# Patient Record
Sex: Female | Born: 1935
Health system: Southern US, Community
[De-identification: ages and names within clinical notes are randomized; demographics above are authoritative.]

---

## 2003-08-09 ENCOUNTER — Other Ambulatory Visit: Payer: Self-pay

## 2003-08-13 ENCOUNTER — Other Ambulatory Visit: Payer: Self-pay

## 2004-03-31 ENCOUNTER — Other Ambulatory Visit: Payer: Self-pay

## 2004-03-31 ENCOUNTER — Inpatient Hospital Stay: Payer: Self-pay | Admitting: Internal Medicine

## 2004-07-05 ENCOUNTER — Ambulatory Visit: Payer: Self-pay | Admitting: Internal Medicine

## 2005-02-09 ENCOUNTER — Emergency Department: Payer: Self-pay | Admitting: Emergency Medicine

## 2005-02-09 ENCOUNTER — Other Ambulatory Visit: Payer: Self-pay

## 2005-02-10 ENCOUNTER — Inpatient Hospital Stay: Payer: Self-pay | Admitting: Cardiovascular Disease

## 2005-02-10 ENCOUNTER — Other Ambulatory Visit: Payer: Self-pay

## 2005-02-11 ENCOUNTER — Other Ambulatory Visit: Payer: Self-pay

## 2005-02-28 ENCOUNTER — Ambulatory Visit: Payer: Self-pay | Admitting: Internal Medicine

## 2005-03-21 ENCOUNTER — Other Ambulatory Visit: Payer: Self-pay

## 2005-03-21 ENCOUNTER — Inpatient Hospital Stay: Payer: Self-pay | Admitting: Internal Medicine

## 2005-04-02 ENCOUNTER — Ambulatory Visit: Payer: Self-pay | Admitting: Internal Medicine

## 2006-07-21 ENCOUNTER — Inpatient Hospital Stay: Payer: Self-pay | Admitting: Gastroenterology

## 2006-09-04 ENCOUNTER — Ambulatory Visit: Payer: Self-pay | Admitting: Internal Medicine

## 2006-09-15 ENCOUNTER — Inpatient Hospital Stay: Payer: Self-pay | Admitting: Internal Medicine

## 2006-09-15 ENCOUNTER — Other Ambulatory Visit: Payer: Self-pay

## 2006-09-16 ENCOUNTER — Other Ambulatory Visit: Payer: Self-pay

## 2007-06-23 ENCOUNTER — Inpatient Hospital Stay: Payer: Self-pay | Admitting: Internal Medicine

## 2007-06-23 ENCOUNTER — Other Ambulatory Visit: Payer: Self-pay

## 2007-09-21 ENCOUNTER — Ambulatory Visit: Payer: Self-pay | Admitting: Internal Medicine

## 2008-06-15 ENCOUNTER — Ambulatory Visit: Payer: Self-pay | Admitting: Internal Medicine

## 2008-09-22 ENCOUNTER — Ambulatory Visit: Payer: Self-pay | Admitting: Internal Medicine

## 2008-10-29 ENCOUNTER — Emergency Department: Payer: Self-pay | Admitting: Internal Medicine

## 2009-09-25 ENCOUNTER — Ambulatory Visit: Payer: Self-pay | Admitting: Internal Medicine

## 2009-10-05 ENCOUNTER — Emergency Department: Payer: Self-pay | Admitting: Unknown Physician Specialty

## 2011-08-02 ENCOUNTER — Encounter: Payer: Self-pay | Admitting: Otolaryngology

## 2011-11-01 ENCOUNTER — Emergency Department: Payer: Self-pay | Admitting: *Deleted

## 2012-06-06 ENCOUNTER — Ambulatory Visit: Payer: Self-pay | Admitting: Orthopedic Surgery

## 2012-06-06 LAB — CBC WITH DIFFERENTIAL/PLATELET
Basophil %: 0.6 %
Eosinophil #: 0.4 10*3/uL (ref 0.0–0.7)
Eosinophil %: 4.5 %
HCT: 32.8 % — ABNORMAL LOW (ref 35.0–47.0)
Lymphocyte %: 24.5 %
MCH: 29.8 pg (ref 26.0–34.0)
MCHC: 32.6 g/dL (ref 32.0–36.0)
Monocyte #: 0.6 x10 3/mm (ref 0.2–0.9)
Neutrophil #: 5.2 10*3/uL (ref 1.4–6.5)

## 2012-06-06 LAB — BASIC METABOLIC PANEL
Anion Gap: 9 (ref 7–16)
BUN: 19 mg/dL — ABNORMAL HIGH (ref 7–18)
Calcium, Total: 9.1 mg/dL (ref 8.5–10.1)
Chloride: 104 mmol/L (ref 98–107)
Creatinine: 1.45 mg/dL — ABNORMAL HIGH (ref 0.60–1.30)
EGFR (African American): 38 — ABNORMAL LOW
Osmolality: 276 (ref 275–301)

## 2012-06-12 ENCOUNTER — Encounter: Payer: Self-pay | Admitting: Internal Medicine

## 2012-06-17 ENCOUNTER — Encounter: Payer: Self-pay | Admitting: Internal Medicine

## 2012-06-17 ENCOUNTER — Ambulatory Visit: Payer: Self-pay | Admitting: Hospice and Palliative Medicine

## 2012-06-25 LAB — BASIC METABOLIC PANEL
Co2: 29 mmol/L (ref 21–32)
Creatinine: 0.86 mg/dL (ref 0.60–1.30)
EGFR (African American): >60
EGFR (Non-African Amer.): >60
Glucose: 100 mg/dL — ABNORMAL HIGH (ref 65–99)
Potassium: 4.5 mmol/L (ref 3.5–5.1)

## 2012-06-25 LAB — CBC WITH DIFFERENTIAL/PLATELET
Basophil #: 0.1 10*3/uL (ref 0.0–0.1)
Basophil %: 1.3 %
Eosinophil #: 0.2 10*3/uL (ref 0.0–0.7)
Eosinophil %: 2.5 %
HCT: 37.5 % (ref 35.0–47.0)
HGB: 12.4 g/dL (ref 12.0–16.0)
MCH: 31.3 pg (ref 26.0–34.0)
Monocyte %: 11.9 %
Neutrophil %: 50.9 %
RBC: 3.97 10*6/uL (ref 3.80–5.20)
RDW: 14.8 % — ABNORMAL HIGH (ref 11.5–14.5)
WBC: 7.3 10*3/uL (ref 3.6–11.0)

## 2012-07-13 LAB — CBC WITH DIFFERENTIAL/PLATELET
Basophil #: 0 10*3/uL (ref 0.0–0.1)
Eosinophil #: 0 10*3/uL (ref 0.0–0.7)
Eosinophil %: 0 %
HCT: 39.6 % (ref 35.0–47.0)
Lymphocyte %: 11.2 %
MCHC: 32.7 g/dL (ref 32.0–36.0)
Monocyte #: 2 x10 3/mm — ABNORMAL HIGH (ref 0.2–0.9)
Monocyte %: 14.7 %
Neutrophil #: 10.1 10*3/uL — ABNORMAL HIGH (ref 1.4–6.5)
RBC: 4.23 10*6/uL (ref 3.80–5.20)
RDW: 15 % — ABNORMAL HIGH (ref 11.5–14.5)

## 2012-07-13 LAB — URINALYSIS, COMPLETE
Bilirubin,UR: NEGATIVE
Blood: NEGATIVE
Glucose,UR: NEGATIVE mg/dL (ref 0–75)
Hyaline Cast: 1
Leukocyte Esterase: NEGATIVE
Nitrite: NEGATIVE
Ph: 6 (ref 4.5–8.0)
Protein: NEGATIVE
RBC,UR: 1 /HPF (ref 0–5)
Specific Gravity: 1.015 (ref 1.003–1.030)
Squamous Epithelial: NONE SEEN
WBC UR: 2 /HPF (ref 0–5)

## 2012-07-13 LAB — COMPREHENSIVE METABOLIC PANEL
Albumin: 3.1 g/dL — ABNORMAL LOW (ref 3.4–5.0)
Anion Gap: 10 (ref 7–16)
Bilirubin,Total: 0.4 mg/dL (ref 0.2–1.0)
Calcium, Total: 9.9 mg/dL (ref 8.5–10.1)
Creatinine: 0.77 mg/dL (ref 0.60–1.30)
EGFR (Non-African Amer.): >60
Glucose: 130 mg/dL — ABNORMAL HIGH (ref 65–99)
Potassium: 3.6 mmol/L (ref 3.5–5.1)
SGPT (ALT): 9 U/L — ABNORMAL LOW (ref 12–78)
Sodium: 136 mmol/L (ref 136–145)
Total Protein: 6.4 g/dL (ref 6.4–8.2)

## 2012-07-18 ENCOUNTER — Encounter: Payer: Self-pay | Admitting: Internal Medicine

## 2012-08-15 DEATH — deceased

## 2012-08-25 ENCOUNTER — Ambulatory Visit: Payer: Self-pay | Admitting: Internal Medicine

## 2013-07-12 IMAGING — CT CT CERVICAL SPINE WITHOUT CONTRAST
1 series · 12 of 14 positions shown, 15 images · non-contrast
Comparison: None

REASON FOR EXAM: fall, distracting injury
COMMENTS:

PROCEDURE:     CT  - CT CERVICAL SPINE WO  - June 06, 2012  [DATE]
RESULT:     Clinical Indication: Trauma
TECHNIQUE: Multiple axial CT images from the skull base to the mid vertebral
body of T1. obtained with sagittal and coronal reformatted images provided.

[Series 5: axial · axial · 0.33mm/px · z∈[-344,-212]mm · 12 of 96 slices shown, 15 images]
[im 8/96  soft-tissue]
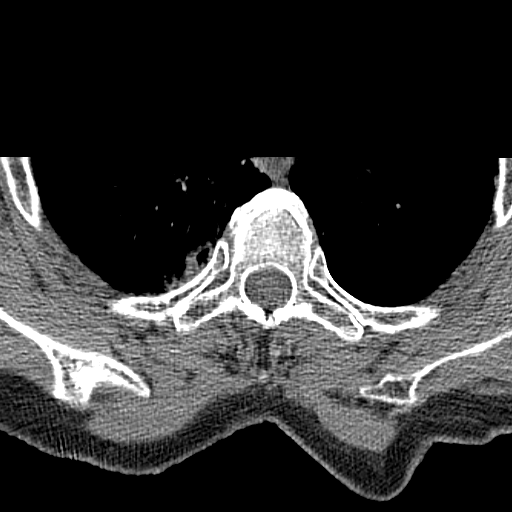
[im 8/96  bone]
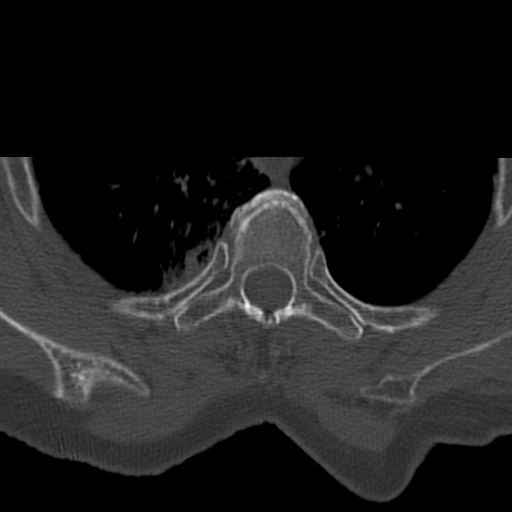
[im 15/96  bone]
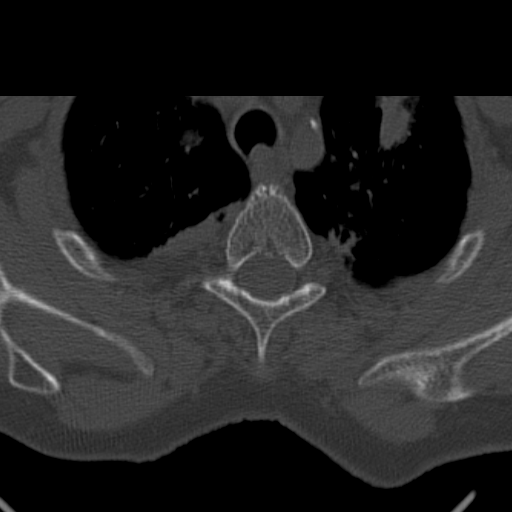
[im 22/96  bone]
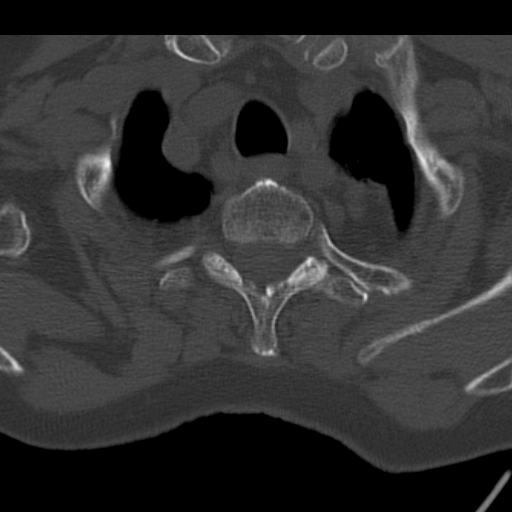
[im 30/96  bone]
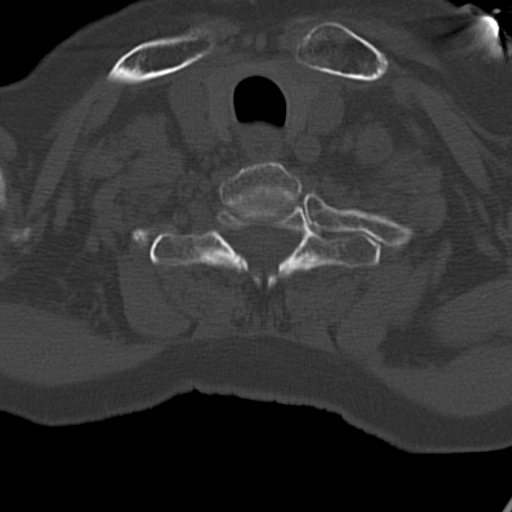
[im 37/96  soft-tissue]
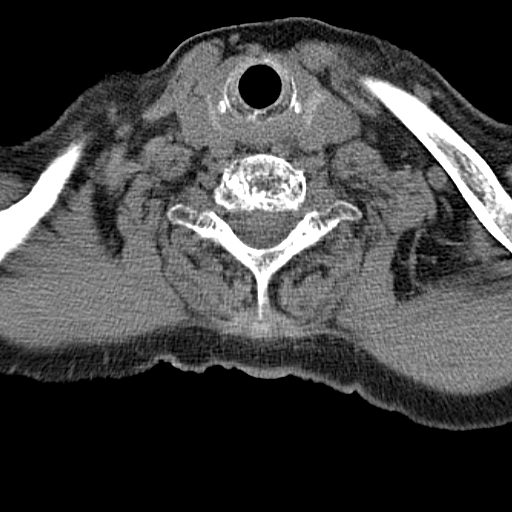
[im 37/96  bone]
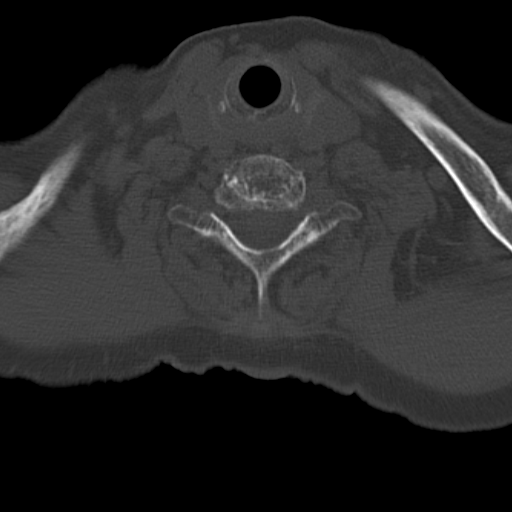
[im 44/96  bone]
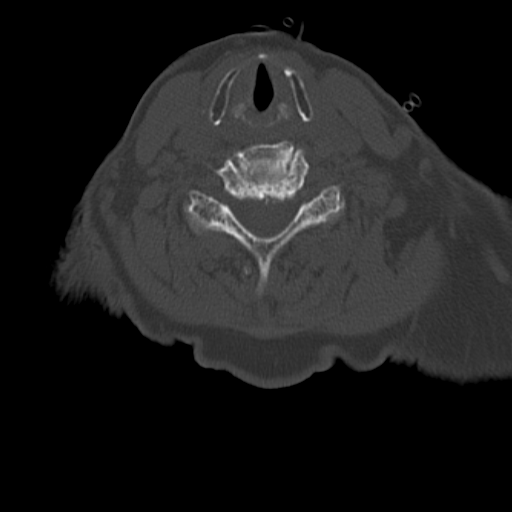
[im 52/96  bone]
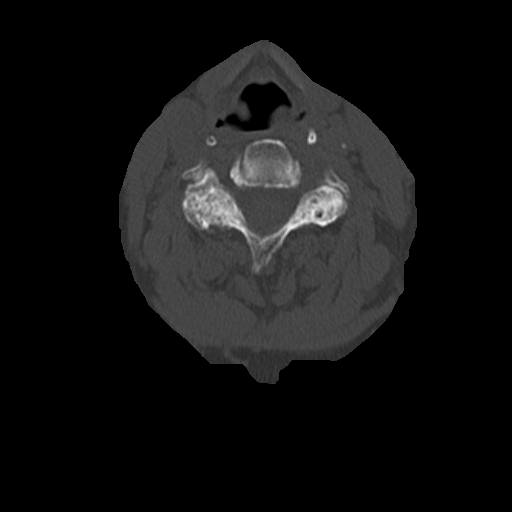
[im 59/96  bone]
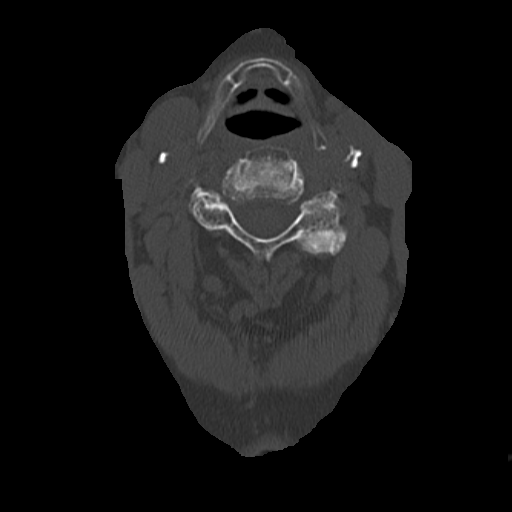
[im 66/96  soft-tissue]
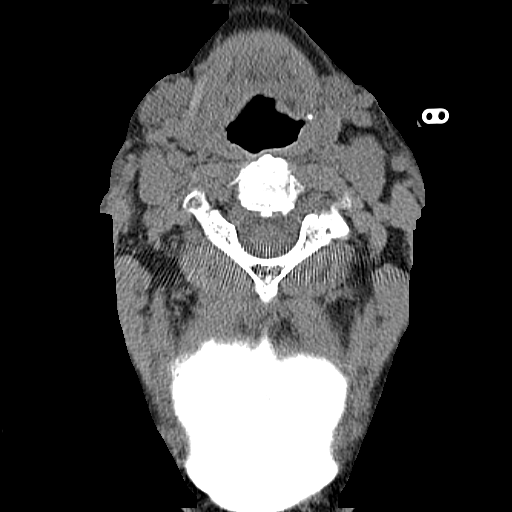
[im 66/96  bone]
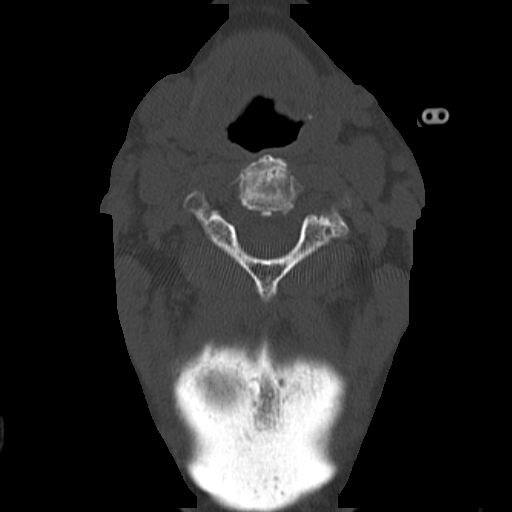
[im 74/96  bone]
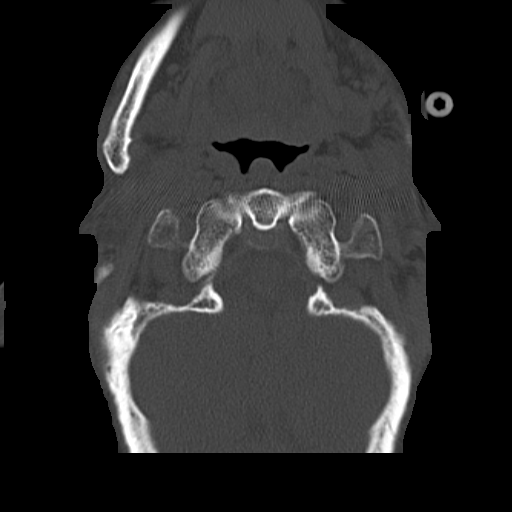
[im 81/96  bone]
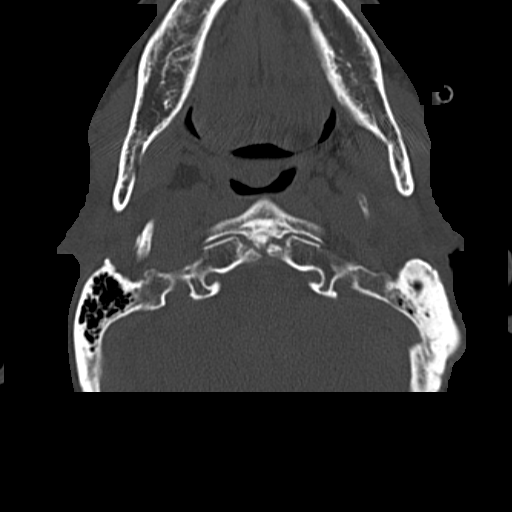
[im 88/96  bone]
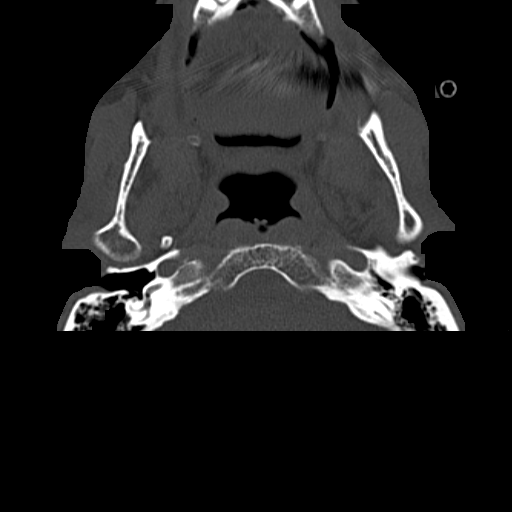

[12 of 14 positions shown; findings below may reference images not displayed]

FINDINGS: The alignment is anatomic. The vertebral body heights are maintained. There
is no acute fracture or static listhesis. The prevertebral soft tissues are
normal. The intraspinal soft tissues are not fully imaged on this
examination due to poor soft tissue contrast, but there is no soft tissue
gross abnormality.

There is degenerative disc disease throughout the cervical spine. There is
degenerative facet arthropathy throughout the cervical spine. There is
osseous fusion across the C6-C7 disc space. There is a mild broad-based disc
bulge at C2-C3, C4-C5, C5-C6.

There is a spiculated partially visualized left apical airspace opacity.
IMPRESSION: 1. No acute osseous injury of the cervical spine.

2. Ligamentous injury is not evaluated. If there is high clinical concern
for ligamentous injury, consider MRI or flexion/extension radiographs as
clinically indicated and tolerated.

3. There is a spiculated partially visualized left apical airspace opacity.
Differential diagnosis includes malignancy versus scarring versus pneumonia.
Recommend CT chest following the resolution the patient's acute illness.

[REDACTED]

## 2014-10-04 NOTE — H&P (Signed)
Subjective/Chief Complaint Left lower leg and proximal humerus fractures    History of Present Illness 81 year old female sustained an injury to her left lower leg when she stepped down off a step from a height of 2 feet.  He son heard a loud crack and the patient fell to the ground.  The patient was brought to the [facility] ER where she was diagnosed with a comminuted, impacted fracture of the left proximal humerus and left lower leg.  Patient is having severe pain of the left lower leg.  Her family including son and daughters are at the bedside.   Past Med/Surgical Hx:  CVA/Stroke:   gi bleed 2006:   anemia:   cholesteral:   GERD - Esophageal Reflux:   HTN:   TIA - Transient Ischemic Attack:   Tonsillectomy:   Appendectomy:   Tubal Ligation:   Spinal Fusion:   Cataracy Surgery:   Hysterectomy:   Bladder Tack:   Bowel resection:   Corrected Vaginal Prolapse:   Lens Implants:   ALLERGIES:  Aggrenox: Other  Penicillin: Unknown  Codeine: Unknown  Morphine: Unknown  HOME MEDICATIONS: Medication Instructions Status  sulfamethoxazole-trimethoprim 800 mg-160 mg oral tablet 1 tab(s) orally 2 times a day x 10 days for UTI. Active  simvastatin 20 mg oral tablet 1 tab(s) orally once a day (in the evening) for high cholesterol. Active  fosinopril 10 mg oral tablet 1 tab(s) orally once a day (in the morning) for high blood pressure. Active  omeprazole 20 mg oral delayed release capsule 1 cap(s) orally 2 times a day for acid reflux. Active  meloxicam 15 mg oral tablet 1 tab(s) orally once a day (in the morning) for arthritis. Active  Aspirin Enteric Coated 81 mg oral delayed release tablet 1 tab(s) orally 2 times a day Active  docusate sodium 100 mg oral capsule 2 caps (28m) orally once a day (in the evening) for constipation. Active  Vitamin B12 500 mcg oral tablet 1 tab(s) orally once a day (in the morning) Active  multivitamin 1 tab(s) orally once a day (in the morning) Active   Calci-Chew 2 gummies (10027m orally once a day. Active   Family and Social History:   Family History Non-Contributory    Place of Living Home   Review of Systems:   Subjective/Chief Complaint Left leg and shoulder pain    Medications/Allergies Reviewed Medications/Allergies reviewed   Physical Exam:   GEN no acute distress    HEENT PERRL, hearing intact to voice, Oropharynx clear    NECK supple    RESP normal resp effort    EXTR Left upper extremity has intact skin with mild shoulder swelling.  NVI in left upper extremity.  Her left lower leg is swollen and tense.  She has intact sensation to light touch.  She can flex and extend her toes weakly.  She did not have significant pain with passive stretch of the toes on the left side. Her foot appears pink.  Pulses are palpable.    SKIN normal to palpation, Skin is intact in left upper and lower extremities.  Ecchymosis over left leg.    NEURO motor/sensory function intact    PSYCH alert   Lab Results: Routine BB:  21-Dec-13 19:54    ABO Group + Rh Type O Positive   Antibody Screen NEGATIVE (Result(s) reported on 06 Jun 2012 at 08:47PM.)  Routine Chem:  21-Dec-13 19:54    Glucose, Serum  137   BUN  19  Creatinine (comp)  1.45   Sodium, Serum 136   Potassium, Serum 4.0   Chloride, Serum 104   CO2, Serum 23   Calcium (Total), Serum 9.1   Anion Gap 9   Osmolality (calc) 276   eGFR (African American)  38   eGFR (Non-African American)  33 (eGFR values <5m/min/1.73 m2 may be an indication of chronic kidney disease (CKD). Calculated eGFR is useful in patients with stable renal function. The eGFR calculation will not be reliable in acutely ill patients when serum creatinine is changing rapidly. It is not useful in  patients on dialysis. The eGFR calculation may not be applicable to patients at the low and high extremes of body sizes, pregnant women, and vegetarians.)  Routine Hem:  21-Dec-13 19:54    WBC (CBC)  8.2   RBC (CBC)  3.59   Hemoglobin (CBC)  10.7   Hematocrit (CBC)  32.8   Platelet Count (CBC) 158   MCV 91   MCH 29.8   MCHC 32.6   RDW  15.1   Neutrophil % 62.8   Lymphocyte % 24.5   Monocyte % 7.6   Eosinophil % 4.5   Basophil % 0.6   Neutrophil # 5.2   Lymphocyte # 2.0   Monocyte # 0.6   Eosinophil # 0.4   Basophil # 0.1 (Result(s) reported on 06 Jun 2012 at 08:39PM.)   Radiology Results: XRay:    21-Dec-13 21:07, Femur Left   Femur Left   REASON FOR EXAM:    fall  COMMENTS:   LMP: Post Hysterectomy    PROCEDURE: DXR - DXR FEMUR LEFT  - Jun 06 2012  9:07PM     RESULT: Comparison:  None    Findings:    Three views of the left femur demonstrates a depressed lateral tibial   plateau fracture. There is no dislocation The soft tissues are normal.    IMPRESSION:     Lateral tibial plateau fracture .  Dictation Site: 1        Verified By: HJennette Banker M.D., MD     Assessment/Admission Diagnosis Left comminuted, nondisplaced proximal humerus fracture and comminuted fracture of the left tibia and fibula fracture    Plan Patient has swelling of the left lower leg concerning for a compartment syndrome or evolving compartment syndrome.  She has extensive injuries to her left lower leg.  I measured compartment pressures on the left lower leg.  The anterior compartment measured 38 and the posterior compartment measured medially was 40.  I felt given the patient's constellation of injuries and possible compartment syndrome that the patient should be transferred to a tertiary center.  [facility]  [facility] of the fractures, but insisted the compartment syndrome be managed locally PRIOR to transfer.  I feel the patient does require emergent fasciotomies and I am taking her to the OR immediately [place]  I reviewed the risks and benefits of the fasciotomies with the daughters.  They understand the risks include infection, bleeding, nerve or blood vessel injury, need for further surgery, DVT/PE, MI, stroke, pneumonia, respiratory failure, and death.  Surgical site signed as per "right site surgery" protocol.   Electronic Signatures: KThornton Park(MD)  (Signed 22-Dec-13 01:42)  Authored: CHIEF COMPLAINT and HISTORY, PAST MEDICAL/SURGIAL HISTORY, ALLERGIES, HOME  MEDICATIONS, FAMILY AND SOCIAL HISTORY, REVIEW OF SYSTEMS, PHYSICAL EXAM, LABS, Radiology, ASSESSMENT AND PLAN   Last Updated: 22-Dec-13 01:42 by Thornton Park (MD)

## 2014-10-04 NOTE — Op Note (Signed)
PATIENT NAME:  Evelyn Garza, Evelyn Garza MR#:  [id number] DATE OF BIRTH:  06-04-1924  DATE OF PROCEDURE:  06/07/2012  PREOPERATIVE DIAGNOSES:  1. Left leg compartment syndrome. 2. Left tibial shaft and bicondylar tibial plateau fracture with distal fibular fracture.  POSTOPERATIVE DIAGNOSES:  1. Left leg compartment syndrome.  2. Left tibial shaft and bicondylar tibial plateau fracture with distal fibular fracture.  PROCEDURE: Fasciotomies of left lower leg.   SURGEON: Juanell Fairly, MD   ANESTHESIA: General.   COMPLICATIONS: None.   ESTIMATED BLOOD LOSS: 100 mL.   INDICATIONS FOR PROCEDURE: The patient is an 81 year old female who sustained multiple injuries to the left lower leg as noted above. The patient stepped down from approximately 2 feet off a step causing a twisting injury to the lower leg and sustained bicondylar fractures to the left tibial plateau. The lateral fracture had a vertical component which extended well into the proximal tibia. There was a short oblique displaced fracture of the tibial shaft and a minimally displaced fracture of the distal fibula. The patient also had a nondisplaced comminuted fracture of the proximal humerus on the left side. The patient had severe pain of the left lower leg upon presentation. She had significant swelling, erythema, ecchymosis, and tense compartments. Compartment pressures on the anterior compartment was measured at 38, and the posterior compartment measured from the medial side was 40. The patient had a faintly palpable pulse. Her foot appeared perfused. She had intact sensation to light touch but could only weakly flex and extend her toes. Given the entire clinical picture, there was concern for compartment syndrome. The decision was made to perform fasciotomies to avoid any permanent injury to the muscle or nerves of the left lower leg. I reviewed the risks and benefits of this procedure with the patient's daughters, who had power of attorney.  They understand the risks include infection, bleeding, nerve or blood vessel injury and the need for further surgery. Medical complications include but are not limited to deep vein thrombosis and pulmonary embolism, myocardial infarction, stroke, pneumonia, respiratory failure, and death.   A consent form was signed by the daughter.   PROCEDURE NOTE: The patient's left leg was signed with the word "yes" according to the hospital's right site protocol. She was brought to the Operating Room where she underwent general anesthesia. The patient was then prepped and draped in a sterile fashion. A timeout was performed to verify the patient's name, date of birth, medical record number, correct site of surgery, and correct procedure to be performed. It was also used to confirm the patient had received antibiotics and that all appropriate instruments and radiographic studies were available in the room. Once all in attendance were in agreement, the case began.   The patient received 600 mg of clindamycin given her PENICILLIN ALLERGY.   Proposed incisions were drawn out with a surgical marker making sure that there was at least a 7 cm skin bridge between the incisions. Longitudinal incisions on both medial and lateral sides were drawn out with a surgical marker. A skin knife was then used to make the lateral incision first. The subcutaneous tissues were then carefully dissected with a Metzenbaum scissor and pick-up until the deep fascia was identified. This was cleared of overlying hematoma and soft tissue. A deep #10 blade along with a Metzenbaum scissor were then used to perform fasciotomies of the anterior compartment, the lateral compartment, and the superficial and deep posterior compartments. The wound was then copiously irrigated. A second fasciotomy  was then performed medially to release the posterior and deep compartments from the medial side. Again care was taken to carefully dissect the superficial layers.  The saphenous vein was visualized  and carefully retracted anteriorly. Once the fasciotomies were completed, both wounds were again copiously irrigated. The wounds were left open. They were packed with moist 4 x 4's. Layers of 4 x 4's were placed over the incisions along with ABD. Webril was gently wrapped around the leg without any tension whatsoever. An AO splint was then applied and held onto the leg with a bias wrapped very loosely. A 24-inch knee immobilizer was also applied to the left leg to stabilize the tibial plateau and tibial shaft fractures. I was scrubbed and present for the entire case, and all sharp and instrument counts were correct at the conclusion of the case. I spoke with the patient's family in the postoperative waiting area to let them know the case had gone without complication and the patient was stable in the recovery room.      PLAN: [facility] The patient has been accepted by Dr. Wendee CoppGrant Garrigues.    ____________________________ Kathreen DevoidKevin L. Cyncere Sontag, MD [email or web address] D: 06/07/2012 03:27:58 ET T: 06/07/2012 07:55:15 ET JOB#: [id number]  cc: Kathreen DevoidKevin L. Wadsworth Skolnick, MD, <Dictator> Kathreen DevoidKEVIN L Shantanique Hodo MD ELECTRONICALLY SIGNED 06/09/2012 13:52
